# Patient Record
Sex: Male | Born: 1965 | Race: White | Hispanic: No | Marital: Married | Smoking: Never smoker
Health system: Southern US, Community
[De-identification: ages and names within clinical notes are randomized; demographics above are authoritative.]

## PROBLEM LIST (undated history)

## (undated) DIAGNOSIS — H9190 Unspecified hearing loss, unspecified ear: Secondary | ICD-10-CM

## (undated) DIAGNOSIS — Q631 Lobulated, fused and horseshoe kidney: Secondary | ICD-10-CM

## (undated) DIAGNOSIS — R109 Unspecified abdominal pain: Secondary | ICD-10-CM

## (undated) DIAGNOSIS — N451 Epididymitis: Secondary | ICD-10-CM

## (undated) DIAGNOSIS — N5089 Other specified disorders of the male genital organs: Secondary | ICD-10-CM

## (undated) DIAGNOSIS — R52 Pain, unspecified: Secondary | ICD-10-CM

## (undated) HISTORY — DX: Other specified disorders of the male genital organs: N50.89

## (undated) HISTORY — DX: Pain, unspecified: R52

## (undated) HISTORY — DX: Epididymitis: N45.1

## (undated) HISTORY — DX: Unspecified hearing loss, unspecified ear: H91.90

## (undated) HISTORY — DX: Unspecified abdominal pain: R10.9

## (undated) HISTORY — DX: Lobulated, fused and horseshoe kidney: Q63.1

---

## 2011-05-28 ENCOUNTER — Encounter (HOSPITAL_BASED_OUTPATIENT_CLINIC_OR_DEPARTMENT_OTHER): Payer: Self-pay | Admitting: *Deleted

## 2011-05-28 ENCOUNTER — Emergency Department (HOSPITAL_BASED_OUTPATIENT_CLINIC_OR_DEPARTMENT_OTHER)
Admission: EM | Admit: 2011-05-28 | Discharge: 2011-05-28 | Disposition: A | Discharge status: Home / Self Care | Payer: BC Managed Care – PPO | Attending: Emergency Medicine | Admitting: Emergency Medicine

## 2011-05-28 DIAGNOSIS — S0120XA Unspecified open wound of nose, initial encounter: Secondary | ICD-10-CM | POA: Insufficient documentation

## 2011-05-28 DIAGNOSIS — Y9373 Activity, racquet and hand sports: Secondary | ICD-10-CM | POA: Insufficient documentation

## 2011-05-28 DIAGNOSIS — W219XXA Striking against or struck by unspecified sports equipment, initial encounter: Secondary | ICD-10-CM | POA: Insufficient documentation

## 2011-05-28 DIAGNOSIS — S0121XA Laceration without foreign body of nose, initial encounter: Secondary | ICD-10-CM

## 2011-05-28 NOTE — ED Provider Notes (Signed)
Medical screening examination/treatment/procedure(s) were performed by non-physician practitioner and as supervising physician I was immediately available for consultation/collaboration.   Alejandria Wessells, MD 05/28/11 1448 

## 2011-05-28 NOTE — ED Provider Notes (Signed)
History     CSN: [id number]  Arrival date & time 05/28/11  1334   First MD Initiated Contact with Patient 05/28/11 1349      Chief Complaint  Patient presents with  . Facial Injury    (Consider location/radiation/quality/duration/timing/severity/associated sxs/prior treatment) Patient is a 46 y.o. male presenting with facial injury. The history is provided by the patient. No language interpreter was used.  Facial Injury  The incident occurred just prior to arrival. The injury mechanism was a direct blow. The injury was related to sports. There is an injury to the face. The pain is mild. It is unlikely that a foreign body is present. He has been behaving normally.  Pt was hit in the nose with a racquet ball racquet.  Pt has a laceration across his nose  History reviewed. No pertinent past medical history.  History reviewed. No pertinent past surgical history.  No family history on file.  History  Substance Use Topics  . Smoking status: Never Smoker   . Smokeless tobacco: Not on file  . Alcohol Use: No      Review of Systems  HENT: Positive for nosebleeds.   Skin: Positive for wound.  All other systems reviewed and are negative.    Allergies  Review of patient's allergies indicates no known allergies.  Home Medications  No current outpatient prescriptions on file.  BP 129/67  Pulse 80  Temp(Src) 98.1 F (36.7 C) (Oral)  Resp 18  Ht 6' (1.829 m)  Wt 165 lb (74.844 kg)  BMI 22.38 kg/m2  SpO2 100%  Physical Exam  Nursing note and vitals reviewed. Constitutional: He appears well-developed and well-nourished.  HENT:  Head: Normocephalic.  Right Ear: External ear normal.  Left Ear: External ear normal.  Nose: Nose normal.  Mouth/Throat: Oropharynx is clear and moist.       5mm laceration across nose  Eyes: Conjunctivae and EOM are normal. Pupils are equal, round, and reactive to light.  Neck: Normal range of motion. Neck supple.  Cardiovascular: Normal  rate and normal heart sounds.   Pulmonary/Chest: Effort normal.  Musculoskeletal: Normal range of motion.  Neurological: He is alert.  Skin: Skin is warm.  Psychiatric: He has a normal mood and affect.    ED Course  LACERATION REPAIR Date/Time: 05/28/2011 2:43 PM Performed by: Langston Masker Authorized by: Langston Masker Consent: Verbal consent not obtained. Risks and benefits: risks, benefits and alternatives were discussed Consent given by: patient Time out: Immediately prior to procedure a "time out" was called to verify the correct patient, procedure, equipment, support staff and site/side marked as required. Body area: head/neck Laceration length: 0.5 cm Foreign bodies: no foreign bodies Tendon involvement: none Preparation: Patient was prepped and draped in the usual sterile fashion. Irrigation solution: saline Skin closure: glue Approximation difficulty: simple Patient tolerance: Patient tolerated the procedure well with no immediate complications.   (including critical care time)  Labs Reviewed - No data to display No results found.   1. Laceration of nose       MDM          Langston Masker, [place] 05/28/11 1445

## 2011-05-28 NOTE — Discharge Instructions (Signed)
Facial Laceration A facial laceration is a cut on the face. It can take 1 to 2 years for the scar to heal completely. HOME CARE  For stitches (sutures):  Keep the cut clean and dry.   If you have a bandage (dressing), change it at least once a day. Change the bandage if it gets wet or dirty, or as told by your doctor.   Wash the cut with soap and water 2 times a day. Rinse the cut with water. Pat it dry with a clean towel.   Put a thin layer of medicated cream on the cut as told by your doctor.   You may shower after the first 24 hours. Do not soak the cut in water until the stitches are removed.   Only take medicines as told by your doctor.   Have your stitches removed as told by your doctor.   Do not wear makeup until a few days after your stitches are removed.  For skin adhesive strips:  Keep the cut clean and dry.   Do not get the strips wet. You may take a bath, but be careful to keep the cut dry.   If the cut gets wet, pat it dry with a clean towel.   The strips will fall off on their own. Do not remove the strips that are still stuck to the cut.  For wound glue:  You may shower or take baths. Do not soak or scrub the cut. Do not swim. Avoid heavy sweating until the glue falls off on its own. After a shower or bath, pat the cut dry with a clean towel.   Do not put medicine or makeup on your cut until the glue falls off.   If you have a bandage, do not put tape over the glue.   Avoid lots of sunlight or tanning lamps until the glue falls off. Put sunscreen on the cut for the first year to reduce your scar.   The glue will fall off on its own. Do not pick at the glue.  You may need a tetanus shot if:  You cannot remember when you had your last tetanus shot.   You have never had a tetanus shot.  If you need a tetanus shot and you choose not to have one, you may get tetanus. Sickness from tetanus can be serious. GET HELP RIGHT AWAY IF:   Your cut gets red, painful,  or puffy (swollen).   There is yellowish-white fluid (pus) coming from the cut.   You have chills or a fever.  MAKE SURE YOU:   Understand these instructions.   Will watch your condition.   Will get help right away if you are not doing well or get worse.  Document Released: 09/01/2007 Document Revised: 11/25/2010 Document Reviewed: 09/08/2010 [employer] Patient Information 2012 [employer] 

## 2011-05-28 NOTE — ED Notes (Signed)
Hit in the face with a racket while playing racket ball this am. Laceration across the bridge of his nose. Bleeding controlled.

## 2013-06-20 ENCOUNTER — Other Ambulatory Visit (HOSPITAL_BASED_OUTPATIENT_CLINIC_OR_DEPARTMENT_OTHER): Payer: Self-pay | Admitting: Family Medicine

## 2013-06-20 DIAGNOSIS — N509 Disorder of male genital organs, unspecified: Secondary | ICD-10-CM

## 2013-06-20 DIAGNOSIS — R52 Pain, unspecified: Secondary | ICD-10-CM

## 2013-06-21 ENCOUNTER — Other Ambulatory Visit (HOSPITAL_BASED_OUTPATIENT_CLINIC_OR_DEPARTMENT_OTHER): Payer: Self-pay | Admitting: Family Medicine

## 2013-06-21 DIAGNOSIS — N509 Disorder of male genital organs, unspecified: Secondary | ICD-10-CM

## 2013-06-21 DIAGNOSIS — R52 Pain, unspecified: Secondary | ICD-10-CM

## 2013-06-22 ENCOUNTER — Ambulatory Visit (HOSPITAL_BASED_OUTPATIENT_CLINIC_OR_DEPARTMENT_OTHER): Payer: BC Managed Care – PPO

## 2013-06-23 ENCOUNTER — Ambulatory Visit (HOSPITAL_BASED_OUTPATIENT_CLINIC_OR_DEPARTMENT_OTHER)
Admission: RE | Admit: 2013-06-23 | Discharge: 2013-06-23 | Disposition: A | Discharge status: Home / Self Care | Payer: BC Managed Care – PPO | Source: Ambulatory Visit | Attending: Family Medicine | Admitting: Family Medicine

## 2013-06-23 DIAGNOSIS — R52 Pain, unspecified: Secondary | ICD-10-CM

## 2013-06-23 DIAGNOSIS — N509 Disorder of male genital organs, unspecified: Secondary | ICD-10-CM

## 2013-06-23 DIAGNOSIS — N508 Other specified disorders of male genital organs: Secondary | ICD-10-CM | POA: Insufficient documentation

## 2013-06-26 ENCOUNTER — Ambulatory Visit (HOSPITAL_BASED_OUTPATIENT_CLINIC_OR_DEPARTMENT_OTHER): Payer: BC Managed Care – PPO

## 2013-06-27 ENCOUNTER — Other Ambulatory Visit (HOSPITAL_COMMUNITY): Payer: Self-pay | Admitting: Urology

## 2013-06-27 DIAGNOSIS — N5089 Other specified disorders of the male genital organs: Secondary | ICD-10-CM

## 2013-08-22 ENCOUNTER — Ambulatory Visit (HOSPITAL_COMMUNITY)
Admission: RE | Admit: 2013-08-22 | Discharge: 2013-08-22 | Disposition: A | Discharge status: Home / Self Care | Payer: BC Managed Care – PPO | Source: Ambulatory Visit | Attending: Urology | Admitting: Urology

## 2013-08-22 DIAGNOSIS — N508 Other specified disorders of male genital organs: Secondary | ICD-10-CM | POA: Insufficient documentation

## 2013-08-22 DIAGNOSIS — N5089 Other specified disorders of the male genital organs: Secondary | ICD-10-CM

## 2013-11-21 ENCOUNTER — Other Ambulatory Visit (HOSPITAL_COMMUNITY): Payer: Self-pay | Admitting: Urology

## 2013-11-21 DIAGNOSIS — N508 Other specified disorders of male genital organs: Secondary | ICD-10-CM

## 2013-11-28 ENCOUNTER — Other Ambulatory Visit (HOSPITAL_COMMUNITY): Payer: Self-pay | Admitting: Urology

## 2013-11-28 DIAGNOSIS — N508 Other specified disorders of male genital organs: Secondary | ICD-10-CM

## 2013-12-04 ENCOUNTER — Ambulatory Visit (HOSPITAL_COMMUNITY): Payer: BC Managed Care – PPO

## 2013-12-13 ENCOUNTER — Ambulatory Visit (HOSPITAL_COMMUNITY): Payer: BC Managed Care – PPO

## 2013-12-14 ENCOUNTER — Ambulatory Visit (HOSPITAL_COMMUNITY)
Admission: RE | Admit: 2013-12-14 | Discharge: 2013-12-14 | Disposition: A | Discharge status: Home / Self Care | Payer: BC Managed Care – PPO | Source: Ambulatory Visit | Attending: Urology | Admitting: Urology

## 2013-12-14 DIAGNOSIS — N433 Hydrocele, unspecified: Secondary | ICD-10-CM | POA: Insufficient documentation

## 2013-12-14 DIAGNOSIS — N508 Other specified disorders of male genital organs: Secondary | ICD-10-CM

## 2014-07-19 ENCOUNTER — Other Ambulatory Visit: Payer: Self-pay | Admitting: Family Medicine

## 2014-07-19 DIAGNOSIS — M545 Low back pain: Secondary | ICD-10-CM

## 2014-08-04 ENCOUNTER — Other Ambulatory Visit: Payer: Self-pay

## 2014-08-07 ENCOUNTER — Ambulatory Visit
Admission: RE | Admit: 2014-08-07 | Discharge: 2014-08-07 | Disposition: A | Discharge status: Home / Self Care | Payer: BLUE CROSS/BLUE SHIELD | Source: Ambulatory Visit | Attending: Family Medicine | Admitting: Family Medicine

## 2014-08-07 DIAGNOSIS — M545 Low back pain: Secondary | ICD-10-CM

## 2016-08-24 DIAGNOSIS — H10413 Chronic giant papillary conjunctivitis, bilateral: Secondary | ICD-10-CM | POA: Diagnosis not present

## 2016-12-28 DIAGNOSIS — Z1211 Encounter for screening for malignant neoplasm of colon: Secondary | ICD-10-CM | POA: Diagnosis not present

## 2017-01-26 DIAGNOSIS — K589 Irritable bowel syndrome without diarrhea: Secondary | ICD-10-CM | POA: Diagnosis not present

## 2017-01-26 DIAGNOSIS — Z1211 Encounter for screening for malignant neoplasm of colon: Secondary | ICD-10-CM | POA: Diagnosis not present

## 2017-01-26 DIAGNOSIS — D126 Benign neoplasm of colon, unspecified: Secondary | ICD-10-CM | POA: Diagnosis not present

## 2017-02-01 DIAGNOSIS — B353 Tinea pedis: Secondary | ICD-10-CM | POA: Diagnosis not present

## 2017-02-01 DIAGNOSIS — D225 Melanocytic nevi of trunk: Secondary | ICD-10-CM | POA: Diagnosis not present

## 2017-02-01 DIAGNOSIS — L821 Other seborrheic keratosis: Secondary | ICD-10-CM | POA: Diagnosis not present

## 2017-02-01 DIAGNOSIS — L814 Other melanin hyperpigmentation: Secondary | ICD-10-CM | POA: Diagnosis not present

## 2017-02-01 DIAGNOSIS — L309 Dermatitis, unspecified: Secondary | ICD-10-CM | POA: Diagnosis not present

## 2017-03-16 DIAGNOSIS — Z136 Encounter for screening for cardiovascular disorders: Secondary | ICD-10-CM | POA: Diagnosis not present

## 2017-03-16 DIAGNOSIS — Z131 Encounter for screening for diabetes mellitus: Secondary | ICD-10-CM | POA: Diagnosis not present

## 2017-03-16 DIAGNOSIS — Z0000 Encounter for general adult medical examination without abnormal findings: Secondary | ICD-10-CM | POA: Diagnosis not present

## 2017-05-02 DIAGNOSIS — S43005A Unspecified dislocation of left shoulder joint, initial encounter: Secondary | ICD-10-CM | POA: Diagnosis not present

## 2017-05-02 DIAGNOSIS — M25311 Other instability, right shoulder: Secondary | ICD-10-CM | POA: Diagnosis not present

## 2017-05-09 DIAGNOSIS — M545 Low back pain: Secondary | ICD-10-CM | POA: Diagnosis not present

## 2017-05-16 DIAGNOSIS — M545 Low back pain: Secondary | ICD-10-CM | POA: Diagnosis not present

## 2017-05-16 DIAGNOSIS — M25311 Other instability, right shoulder: Secondary | ICD-10-CM | POA: Diagnosis not present

## 2017-05-16 DIAGNOSIS — M25511 Pain in right shoulder: Secondary | ICD-10-CM | POA: Diagnosis not present

## 2017-05-18 ENCOUNTER — Other Ambulatory Visit: Payer: Self-pay | Admitting: Family Medicine

## 2017-05-18 DIAGNOSIS — M67911 Unspecified disorder of synovium and tendon, right shoulder: Secondary | ICD-10-CM

## 2017-05-22 ENCOUNTER — Ambulatory Visit
Admission: RE | Admit: 2017-05-22 | Discharge: 2017-05-22 | Disposition: A | Discharge status: Home / Self Care | Payer: 59 | Source: Ambulatory Visit | Attending: Family Medicine | Admitting: Family Medicine

## 2017-05-22 DIAGNOSIS — S43006A Unspecified dislocation of unspecified shoulder joint, initial encounter: Secondary | ICD-10-CM | POA: Diagnosis not present

## 2017-05-22 DIAGNOSIS — M67911 Unspecified disorder of synovium and tendon, right shoulder: Secondary | ICD-10-CM

## 2017-05-25 DIAGNOSIS — M75111 Incomplete rotator cuff tear or rupture of right shoulder, not specified as traumatic: Secondary | ICD-10-CM | POA: Diagnosis not present

## 2017-05-28 DIAGNOSIS — S43394A Dislocation of other parts of right shoulder girdle, initial encounter: Secondary | ICD-10-CM | POA: Diagnosis not present

## 2017-05-28 DIAGNOSIS — S46091A Other injury of muscle(s) and tendon(s) of the rotator cuff of right shoulder, initial encounter: Secondary | ICD-10-CM | POA: Diagnosis not present

## 2017-05-30 DIAGNOSIS — S43394A Dislocation of other parts of right shoulder girdle, initial encounter: Secondary | ICD-10-CM | POA: Diagnosis not present

## 2017-05-30 DIAGNOSIS — S46091A Other injury of muscle(s) and tendon(s) of the rotator cuff of right shoulder, initial encounter: Secondary | ICD-10-CM | POA: Diagnosis not present

## 2017-06-01 DIAGNOSIS — S43394A Dislocation of other parts of right shoulder girdle, initial encounter: Secondary | ICD-10-CM | POA: Diagnosis not present

## 2017-06-01 DIAGNOSIS — S46091A Other injury of muscle(s) and tendon(s) of the rotator cuff of right shoulder, initial encounter: Secondary | ICD-10-CM | POA: Diagnosis not present

## 2017-06-03 DIAGNOSIS — E7800 Pure hypercholesterolemia, unspecified: Secondary | ICD-10-CM | POA: Diagnosis not present

## 2017-06-03 DIAGNOSIS — S43394A Dislocation of other parts of right shoulder girdle, initial encounter: Secondary | ICD-10-CM | POA: Diagnosis not present

## 2017-06-03 DIAGNOSIS — S46091A Other injury of muscle(s) and tendon(s) of the rotator cuff of right shoulder, initial encounter: Secondary | ICD-10-CM | POA: Diagnosis not present

## 2017-06-14 DIAGNOSIS — S46091A Other injury of muscle(s) and tendon(s) of the rotator cuff of right shoulder, initial encounter: Secondary | ICD-10-CM | POA: Diagnosis not present

## 2017-06-14 DIAGNOSIS — S43394A Dislocation of other parts of right shoulder girdle, initial encounter: Secondary | ICD-10-CM | POA: Diagnosis not present

## 2017-06-15 DIAGNOSIS — M75111 Incomplete rotator cuff tear or rupture of right shoulder, not specified as traumatic: Secondary | ICD-10-CM | POA: Diagnosis not present

## 2017-06-16 DIAGNOSIS — Z23 Encounter for immunization: Secondary | ICD-10-CM | POA: Diagnosis not present

## 2017-06-23 DIAGNOSIS — J309 Allergic rhinitis, unspecified: Secondary | ICD-10-CM | POA: Diagnosis not present

## 2017-07-04 DIAGNOSIS — M7541 Impingement syndrome of right shoulder: Secondary | ICD-10-CM | POA: Diagnosis not present

## 2017-07-04 DIAGNOSIS — M7501 Adhesive capsulitis of right shoulder: Secondary | ICD-10-CM | POA: Diagnosis not present

## 2017-07-04 DIAGNOSIS — S46011A Strain of muscle(s) and tendon(s) of the rotator cuff of right shoulder, initial encounter: Secondary | ICD-10-CM | POA: Diagnosis not present

## 2017-07-04 DIAGNOSIS — S46011S Strain of muscle(s) and tendon(s) of the rotator cuff of right shoulder, sequela: Secondary | ICD-10-CM | POA: Diagnosis not present

## 2017-07-04 DIAGNOSIS — G8918 Other acute postprocedural pain: Secondary | ICD-10-CM | POA: Diagnosis not present

## 2017-07-05 DIAGNOSIS — M7501 Adhesive capsulitis of right shoulder: Secondary | ICD-10-CM | POA: Diagnosis not present

## 2017-07-05 DIAGNOSIS — M25511 Pain in right shoulder: Secondary | ICD-10-CM | POA: Diagnosis not present

## 2017-07-07 DIAGNOSIS — M7501 Adhesive capsulitis of right shoulder: Secondary | ICD-10-CM | POA: Diagnosis not present

## 2017-07-07 DIAGNOSIS — M25511 Pain in right shoulder: Secondary | ICD-10-CM | POA: Diagnosis not present

## 2017-07-08 DIAGNOSIS — M7501 Adhesive capsulitis of right shoulder: Secondary | ICD-10-CM | POA: Diagnosis not present

## 2017-07-08 DIAGNOSIS — M25511 Pain in right shoulder: Secondary | ICD-10-CM | POA: Diagnosis not present

## 2017-07-11 DIAGNOSIS — M25511 Pain in right shoulder: Secondary | ICD-10-CM | POA: Diagnosis not present

## 2017-07-14 DIAGNOSIS — M25511 Pain in right shoulder: Secondary | ICD-10-CM | POA: Diagnosis not present

## 2017-07-19 DIAGNOSIS — M25511 Pain in right shoulder: Secondary | ICD-10-CM | POA: Diagnosis not present

## 2017-07-19 DIAGNOSIS — Z23 Encounter for immunization: Secondary | ICD-10-CM | POA: Diagnosis not present

## 2017-07-21 DIAGNOSIS — M25511 Pain in right shoulder: Secondary | ICD-10-CM | POA: Diagnosis not present

## 2017-07-26 DIAGNOSIS — M25511 Pain in right shoulder: Secondary | ICD-10-CM | POA: Diagnosis not present

## 2017-07-28 DIAGNOSIS — M25511 Pain in right shoulder: Secondary | ICD-10-CM | POA: Diagnosis not present

## 2017-08-02 DIAGNOSIS — M25511 Pain in right shoulder: Secondary | ICD-10-CM | POA: Diagnosis not present

## 2017-08-05 DIAGNOSIS — M25511 Pain in right shoulder: Secondary | ICD-10-CM | POA: Diagnosis not present

## 2017-08-08 DIAGNOSIS — M25511 Pain in right shoulder: Secondary | ICD-10-CM | POA: Diagnosis not present

## 2017-08-12 DIAGNOSIS — M25511 Pain in right shoulder: Secondary | ICD-10-CM | POA: Diagnosis not present

## 2017-08-15 DIAGNOSIS — M25511 Pain in right shoulder: Secondary | ICD-10-CM | POA: Diagnosis not present

## 2017-08-18 DIAGNOSIS — M25511 Pain in right shoulder: Secondary | ICD-10-CM | POA: Diagnosis not present

## 2017-08-23 DIAGNOSIS — M25511 Pain in right shoulder: Secondary | ICD-10-CM | POA: Diagnosis not present

## 2017-08-25 DIAGNOSIS — Z0100 Encounter for examination of eyes and vision without abnormal findings: Secondary | ICD-10-CM | POA: Diagnosis not present

## 2017-08-26 DIAGNOSIS — M25511 Pain in right shoulder: Secondary | ICD-10-CM | POA: Diagnosis not present

## 2017-08-29 DIAGNOSIS — M25511 Pain in right shoulder: Secondary | ICD-10-CM | POA: Diagnosis not present

## 2017-08-31 DIAGNOSIS — M25511 Pain in right shoulder: Secondary | ICD-10-CM | POA: Diagnosis not present

## 2017-09-05 DIAGNOSIS — M25511 Pain in right shoulder: Secondary | ICD-10-CM | POA: Diagnosis not present

## 2017-09-09 DIAGNOSIS — M25511 Pain in right shoulder: Secondary | ICD-10-CM | POA: Diagnosis not present

## 2017-09-14 DIAGNOSIS — M25511 Pain in right shoulder: Secondary | ICD-10-CM | POA: Diagnosis not present

## 2017-09-21 DIAGNOSIS — M25511 Pain in right shoulder: Secondary | ICD-10-CM | POA: Diagnosis not present

## 2017-10-27 DIAGNOSIS — N4341 Spermatocele of epididymis, single: Secondary | ICD-10-CM | POA: Diagnosis not present

## 2017-10-28 DIAGNOSIS — M5416 Radiculopathy, lumbar region: Secondary | ICD-10-CM | POA: Diagnosis not present

## 2017-12-08 DIAGNOSIS — M5416 Radiculopathy, lumbar region: Secondary | ICD-10-CM | POA: Diagnosis not present

## 2018-01-11 DIAGNOSIS — M5416 Radiculopathy, lumbar region: Secondary | ICD-10-CM | POA: Diagnosis not present

## 2018-01-16 DIAGNOSIS — M545 Low back pain: Secondary | ICD-10-CM | POA: Diagnosis not present

## 2018-01-19 DIAGNOSIS — M545 Low back pain: Secondary | ICD-10-CM | POA: Diagnosis not present

## 2018-01-24 DIAGNOSIS — M545 Low back pain: Secondary | ICD-10-CM | POA: Diagnosis not present

## 2018-01-26 DIAGNOSIS — M545 Low back pain: Secondary | ICD-10-CM | POA: Diagnosis not present

## 2018-01-31 DIAGNOSIS — M545 Low back pain: Secondary | ICD-10-CM | POA: Diagnosis not present

## 2018-02-02 DIAGNOSIS — M545 Low back pain: Secondary | ICD-10-CM | POA: Diagnosis not present

## 2018-02-06 DIAGNOSIS — M545 Low back pain: Secondary | ICD-10-CM | POA: Diagnosis not present

## 2018-02-07 DIAGNOSIS — M5416 Radiculopathy, lumbar region: Secondary | ICD-10-CM | POA: Diagnosis not present

## 2018-02-08 DIAGNOSIS — Z23 Encounter for immunization: Secondary | ICD-10-CM | POA: Diagnosis not present

## 2018-02-09 DIAGNOSIS — M545 Low back pain: Secondary | ICD-10-CM | POA: Diagnosis not present

## 2018-02-13 DIAGNOSIS — M545 Low back pain: Secondary | ICD-10-CM | POA: Diagnosis not present

## 2018-02-16 DIAGNOSIS — M545 Low back pain: Secondary | ICD-10-CM | POA: Diagnosis not present

## 2018-02-20 DIAGNOSIS — M545 Low back pain: Secondary | ICD-10-CM | POA: Diagnosis not present

## 2018-02-22 DIAGNOSIS — M545 Low back pain: Secondary | ICD-10-CM | POA: Diagnosis not present

## 2018-02-28 DIAGNOSIS — M545 Low back pain: Secondary | ICD-10-CM | POA: Diagnosis not present

## 2018-03-02 DIAGNOSIS — M545 Low back pain: Secondary | ICD-10-CM | POA: Diagnosis not present

## 2018-03-04 DIAGNOSIS — Z23 Encounter for immunization: Secondary | ICD-10-CM | POA: Diagnosis not present

## 2018-04-05 ENCOUNTER — Other Ambulatory Visit: Payer: Self-pay | Admitting: Physical Medicine and Rehabilitation

## 2018-04-05 DIAGNOSIS — M5416 Radiculopathy, lumbar region: Secondary | ICD-10-CM

## 2018-04-12 ENCOUNTER — Encounter

## 2018-04-12 ENCOUNTER — Ambulatory Visit
Admission: RE | Admit: 2018-04-12 | Discharge: 2018-04-12 | Disposition: A | Discharge status: Home / Self Care | Payer: BLUE CROSS/BLUE SHIELD | Source: Ambulatory Visit | Attending: Physical Medicine and Rehabilitation | Admitting: Physical Medicine and Rehabilitation

## 2018-04-12 DIAGNOSIS — M5416 Radiculopathy, lumbar region: Secondary | ICD-10-CM

## 2018-04-12 DIAGNOSIS — M47816 Spondylosis without myelopathy or radiculopathy, lumbar region: Secondary | ICD-10-CM | POA: Diagnosis not present

## 2018-04-12 DIAGNOSIS — M5127 Other intervertebral disc displacement, lumbosacral region: Secondary | ICD-10-CM | POA: Diagnosis not present

## 2018-04-12 DIAGNOSIS — M5126 Other intervertebral disc displacement, lumbar region: Secondary | ICD-10-CM | POA: Diagnosis not present

## 2018-04-21 DIAGNOSIS — M5416 Radiculopathy, lumbar region: Secondary | ICD-10-CM | POA: Diagnosis not present

## 2018-05-04 DIAGNOSIS — M5416 Radiculopathy, lumbar region: Secondary | ICD-10-CM | POA: Diagnosis not present

## 2018-05-09 DIAGNOSIS — M5416 Radiculopathy, lumbar region: Secondary | ICD-10-CM | POA: Diagnosis not present

## 2018-05-12 DIAGNOSIS — M5416 Radiculopathy, lumbar region: Secondary | ICD-10-CM | POA: Diagnosis not present

## 2018-05-15 DIAGNOSIS — M5416 Radiculopathy, lumbar region: Secondary | ICD-10-CM | POA: Diagnosis not present

## 2018-05-16 DIAGNOSIS — M79671 Pain in right foot: Secondary | ICD-10-CM | POA: Diagnosis not present

## 2018-05-16 DIAGNOSIS — M19079 Primary osteoarthritis, unspecified ankle and foot: Secondary | ICD-10-CM | POA: Diagnosis not present

## 2018-05-17 DIAGNOSIS — M5416 Radiculopathy, lumbar region: Secondary | ICD-10-CM | POA: Diagnosis not present

## 2018-05-22 DIAGNOSIS — M5416 Radiculopathy, lumbar region: Secondary | ICD-10-CM | POA: Diagnosis not present

## 2018-05-25 DIAGNOSIS — M5416 Radiculopathy, lumbar region: Secondary | ICD-10-CM | POA: Diagnosis not present

## 2018-05-29 DIAGNOSIS — L821 Other seborrheic keratosis: Secondary | ICD-10-CM | POA: Diagnosis not present

## 2018-05-29 DIAGNOSIS — Z23 Encounter for immunization: Secondary | ICD-10-CM | POA: Diagnosis not present

## 2018-05-29 DIAGNOSIS — L570 Actinic keratosis: Secondary | ICD-10-CM | POA: Diagnosis not present

## 2018-05-29 DIAGNOSIS — L814 Other melanin hyperpigmentation: Secondary | ICD-10-CM | POA: Diagnosis not present

## 2018-05-29 DIAGNOSIS — D225 Melanocytic nevi of trunk: Secondary | ICD-10-CM | POA: Diagnosis not present

## 2018-05-29 DIAGNOSIS — D485 Neoplasm of uncertain behavior of skin: Secondary | ICD-10-CM | POA: Diagnosis not present

## 2018-05-30 DIAGNOSIS — M5416 Radiculopathy, lumbar region: Secondary | ICD-10-CM | POA: Diagnosis not present

## 2018-06-01 DIAGNOSIS — M5416 Radiculopathy, lumbar region: Secondary | ICD-10-CM | POA: Diagnosis not present

## 2018-06-08 DIAGNOSIS — M5416 Radiculopathy, lumbar region: Secondary | ICD-10-CM | POA: Diagnosis not present

## 2018-08-29 DIAGNOSIS — H52203 Unspecified astigmatism, bilateral: Secondary | ICD-10-CM | POA: Diagnosis not present

## 2018-08-29 DIAGNOSIS — H5213 Myopia, bilateral: Secondary | ICD-10-CM | POA: Diagnosis not present

## 2018-08-29 DIAGNOSIS — H10413 Chronic giant papillary conjunctivitis, bilateral: Secondary | ICD-10-CM | POA: Diagnosis not present

## 2018-10-12 IMAGING — MR MR SHOULDER*R* W/O CM
5 series · 35 of 40 positions shown · non-contrast
Comparison: None.

CLINICAL DATA: Weakness and limited range of motion of the right
shoulder after a dislocation injury skiing 3 weeks prior to imaging.

EXAM:
MRI OF THE RIGHT SHOULDER WITHOUT CONTRAST
TECHNIQUE: Multiplanar, multisequence MR imaging of the shoulder was performed.
No intravenous contrast was administered.

[Series 6: T2 fat-sat · oblique · 4.0mm · 0.50mm/px · 8 of 16 slices shown (1 of 3)]
[im 1/16]
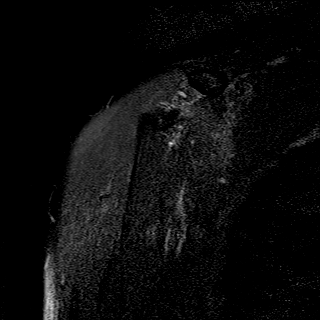
[im 3/16]
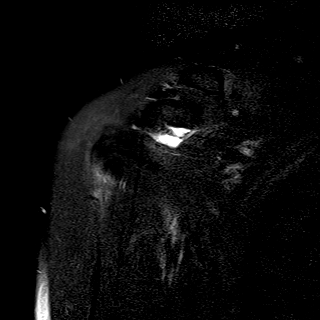
[im 5/16]
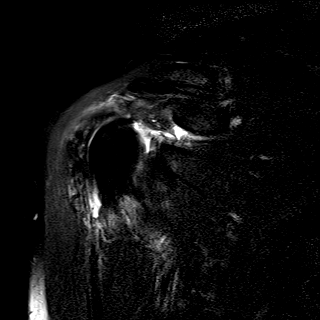
[im 7/16]
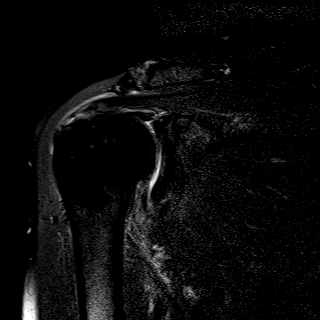
[im 9/16]
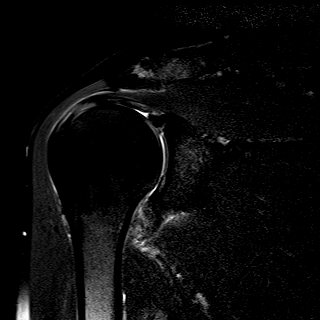
[im 11/16]
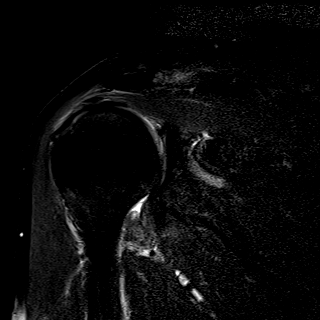
[im 13/16]
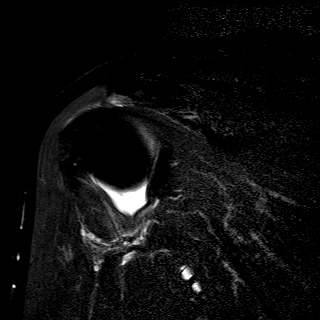
[im 16/16]
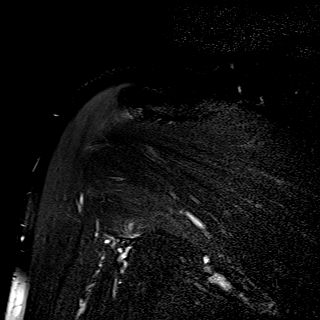

[Series 7: PD · oblique · 4.0mm · 0.50mm/px · 8 of 16 slices shown]
[im 1/16]
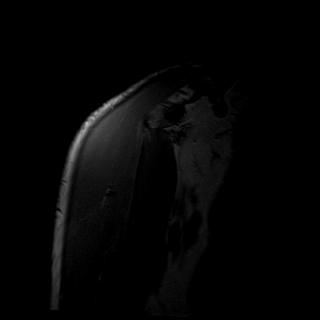
[im 3/16]
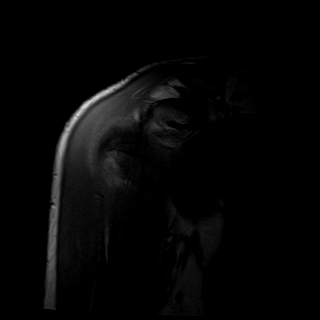
[im 5/16]
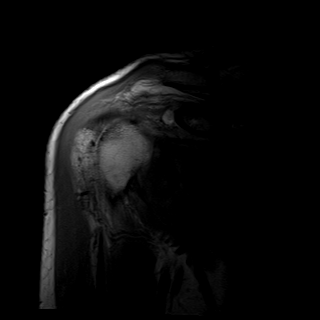
[im 7/16]
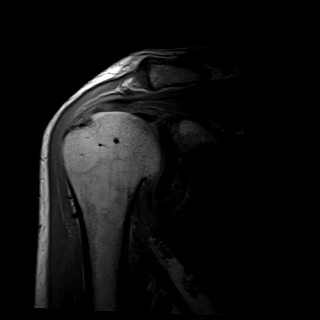
[im 9/16]
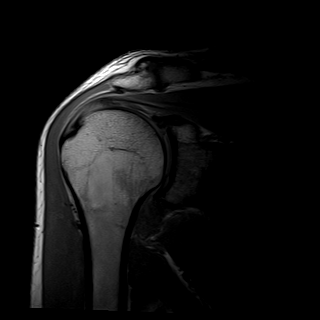
[im 11/16]
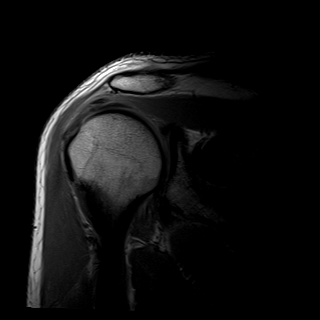
[im 13/16]
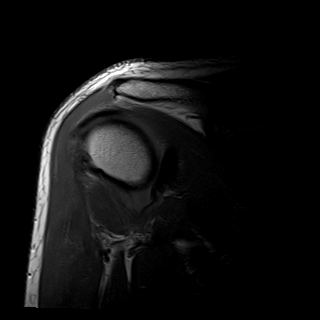
[im 16/16]
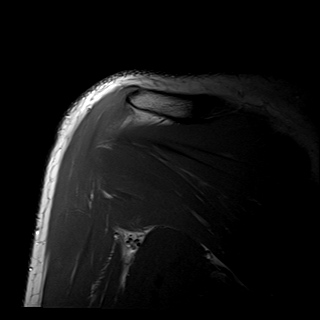

[Series 8: T2 fat-sat · oblique · 4.0mm · 0.50mm/px · 8 of 18 slices shown (2 of 3)]
[im 1/18]
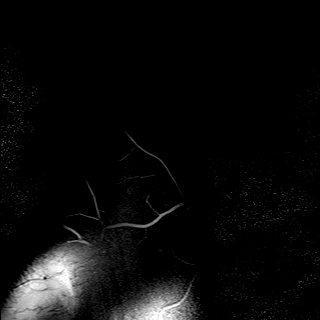
[im 3/18]
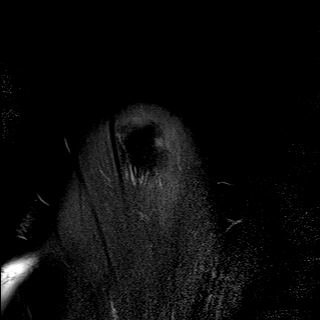
[im 5/18]
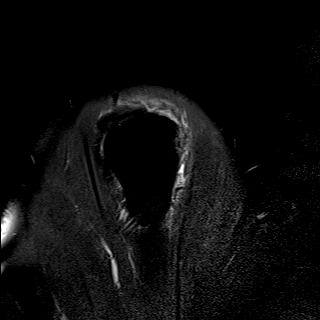
[im 8/18]
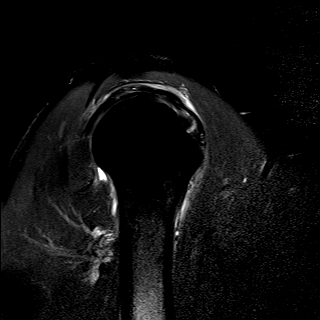
[im 10/18]
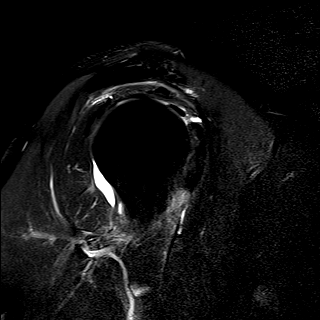
[im 13/18]
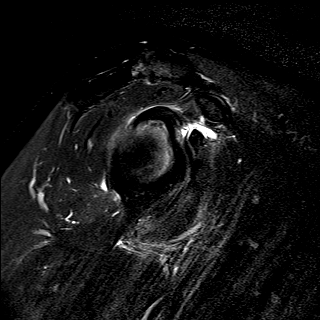
[im 15/18]
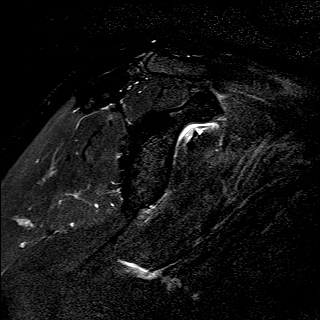
[im 18/18]
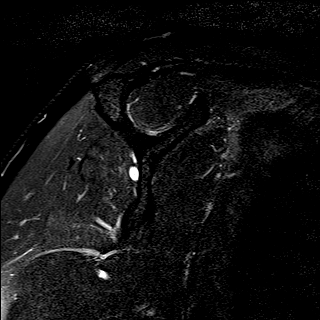

[Series 9: T1 · oblique · 4.0mm · 0.25mm/px · 3 of 18 slices shown]
[im 1/18]
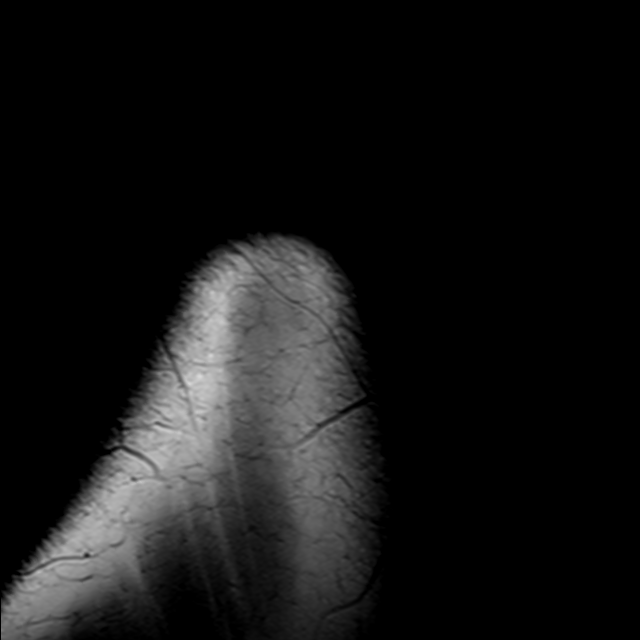
[im 3/18]
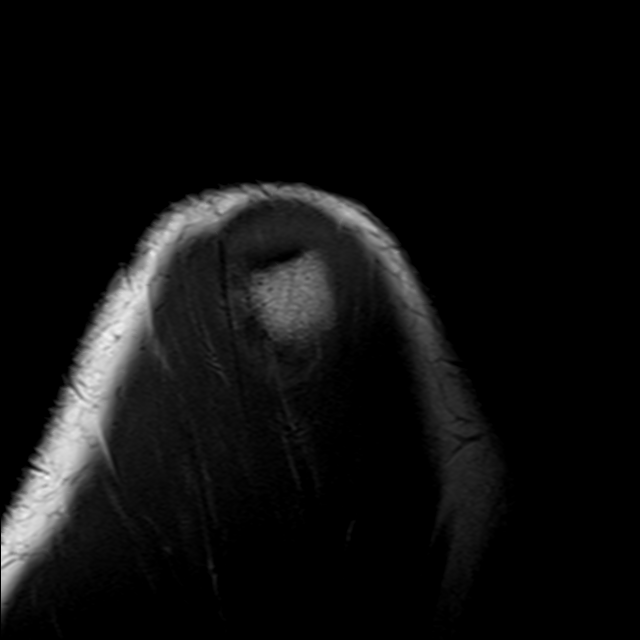
[im 5/18]
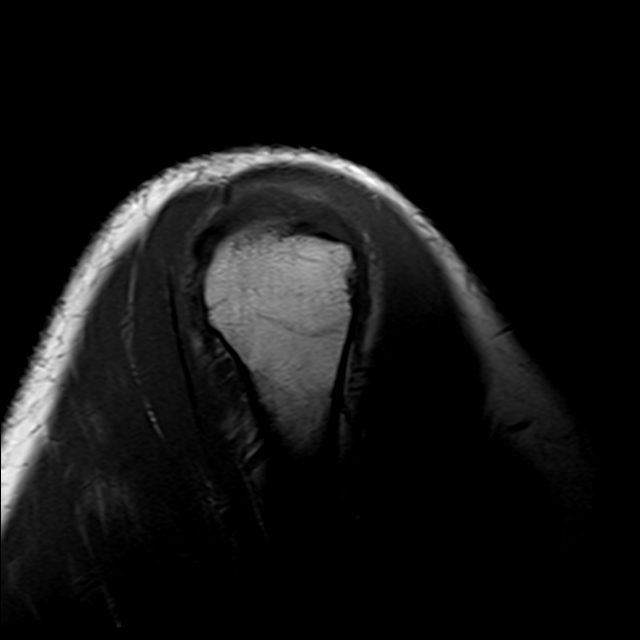

[Series 10: T2 fat-sat · axial · 4.0mm · 0.44mm/px · z∈[-34,+46]mm · 8 of 18 slices shown (3 of 3)]
[im 1/18]
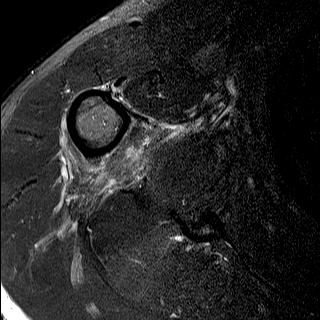
[im 3/18]
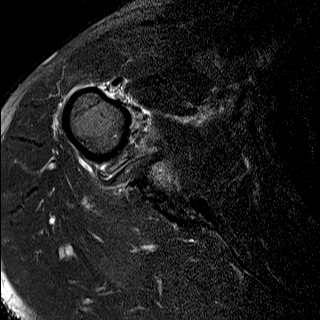
[im 5/18]
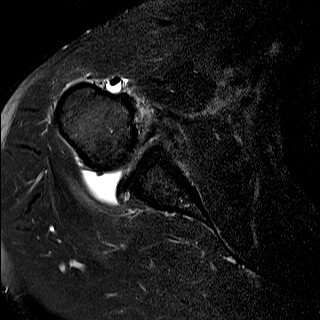
[im 8/18]
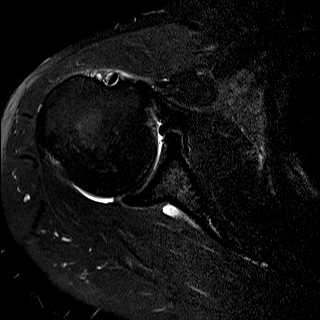
[im 10/18]
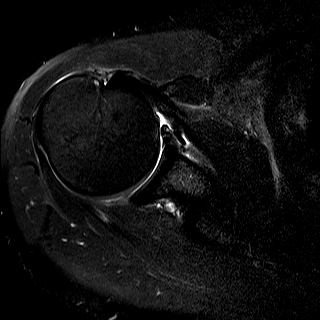
[im 13/18]
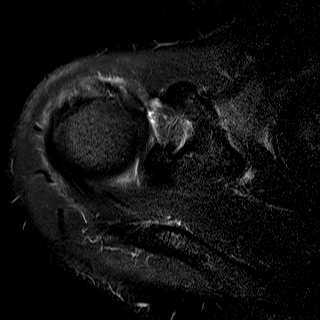
[im 15/18]
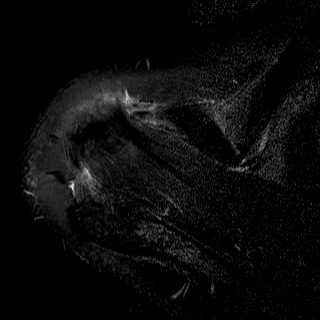
[im 18/18]
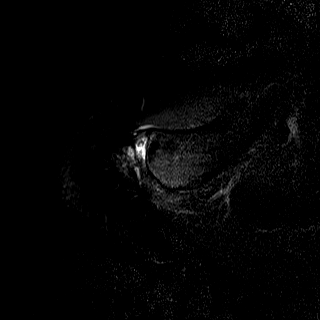

[35 of 40 positions shown; findings below may reference images not displayed]

FINDINGS: Rotator cuff: Moderate supraspinatus tendinopathy with distal
insertional fissuring and suspected partial thickness articular
surface tearing involving the supraspinatus insertion for example on
images 9-11 series 6. Mild tendinopathy of the subscapularis.

Muscles:  Unremarkable

Biceps long head:  Unremarkable

Acromioclavicular Joint: Mild spurring and mild edema within the AC
joint. Type II acromion. Small amount of fluid in the subacromial
subdeltoid bursa.

Glenohumeral Joint: Moderate degenerative chondral thinning.
Abnormal thickened and irregular inferior glenohumeral ligament
especially along the humeral attachment, raising the possibility of
humeral avulsion of the inferior glenohumeral ligament. Synovitis
might be an alternative cause for this appearance. Small
glenohumeral joint effusion.

Labrum: High suspicion for Sahira Lainez or [place] variant tear of the
anterior inferior labrum on images 12-14 series 10.

Bones:  No Hill-Sachs impaction is identified.

Other: No supplemental non-categorized findings.
IMPRESSION: 1. Prominent thickening of the inferior glenohumeral ligament
suspicious for tear, potentially at the humeral attachment.
2. Irregular anterior inferior glenoid labrum best appreciated on
the axial images, suspicious for Sahira Lainez tear.
3. Small glenohumeral joint effusion.
4. Mild subacromial subdeltoid bursitis.
5. Moderate supraspinatus tendinopathy with distal insertional
fissuring potentially extending to the articular surface. Mild
subscapularis tendinopathy.
6. Moderate degenerative chondral thinning in the glenohumeral
joint. Mild spurring of the AC joint.

## 2019-05-21 DIAGNOSIS — R300 Dysuria: Secondary | ICD-10-CM | POA: Diagnosis not present

## 2019-05-30 DIAGNOSIS — L821 Other seborrheic keratosis: Secondary | ICD-10-CM | POA: Diagnosis not present

## 2019-05-30 DIAGNOSIS — L814 Other melanin hyperpigmentation: Secondary | ICD-10-CM | POA: Diagnosis not present

## 2019-05-30 DIAGNOSIS — L578 Other skin changes due to chronic exposure to nonionizing radiation: Secondary | ICD-10-CM | POA: Diagnosis not present

## 2019-05-30 DIAGNOSIS — D485 Neoplasm of uncertain behavior of skin: Secondary | ICD-10-CM | POA: Diagnosis not present

## 2019-05-30 DIAGNOSIS — D225 Melanocytic nevi of trunk: Secondary | ICD-10-CM | POA: Diagnosis not present

## 2019-05-30 DIAGNOSIS — D044 Carcinoma in situ of skin of scalp and neck: Secondary | ICD-10-CM | POA: Diagnosis not present

## 2019-06-26 DIAGNOSIS — L570 Actinic keratosis: Secondary | ICD-10-CM | POA: Diagnosis not present

## 2019-06-26 DIAGNOSIS — D044 Carcinoma in situ of skin of scalp and neck: Secondary | ICD-10-CM | POA: Diagnosis not present

## 2019-06-27 DIAGNOSIS — R300 Dysuria: Secondary | ICD-10-CM | POA: Diagnosis not present

## 2019-08-16 DIAGNOSIS — Z13220 Encounter for screening for lipoid disorders: Secondary | ICD-10-CM | POA: Diagnosis not present

## 2019-08-16 DIAGNOSIS — Z131 Encounter for screening for diabetes mellitus: Secondary | ICD-10-CM | POA: Diagnosis not present

## 2019-08-16 DIAGNOSIS — Z23 Encounter for immunization: Secondary | ICD-10-CM | POA: Diagnosis not present

## 2019-08-16 DIAGNOSIS — Z125 Encounter for screening for malignant neoplasm of prostate: Secondary | ICD-10-CM | POA: Diagnosis not present

## 2019-08-16 DIAGNOSIS — Z0000 Encounter for general adult medical examination without abnormal findings: Secondary | ICD-10-CM | POA: Diagnosis not present

## 2019-09-10 DIAGNOSIS — H10413 Chronic giant papillary conjunctivitis, bilateral: Secondary | ICD-10-CM | POA: Diagnosis not present

## 2019-09-10 DIAGNOSIS — H5213 Myopia, bilateral: Secondary | ICD-10-CM | POA: Diagnosis not present

## 2019-09-11 DIAGNOSIS — R351 Nocturia: Secondary | ICD-10-CM | POA: Diagnosis not present

## 2019-09-11 DIAGNOSIS — R3916 Straining to void: Secondary | ICD-10-CM | POA: Diagnosis not present

## 2019-09-11 DIAGNOSIS — R300 Dysuria: Secondary | ICD-10-CM | POA: Diagnosis not present

## 2019-09-11 DIAGNOSIS — R350 Frequency of micturition: Secondary | ICD-10-CM | POA: Diagnosis not present

## 2019-10-08 DIAGNOSIS — R351 Nocturia: Secondary | ICD-10-CM | POA: Diagnosis not present

## 2019-10-08 DIAGNOSIS — N5201 Erectile dysfunction due to arterial insufficiency: Secondary | ICD-10-CM | POA: Diagnosis not present

## 2019-10-08 DIAGNOSIS — R3916 Straining to void: Secondary | ICD-10-CM | POA: Diagnosis not present

## 2019-10-08 DIAGNOSIS — R300 Dysuria: Secondary | ICD-10-CM | POA: Diagnosis not present

## 2019-10-29 DIAGNOSIS — E7800 Pure hypercholesterolemia, unspecified: Secondary | ICD-10-CM | POA: Diagnosis not present

## 2019-10-29 DIAGNOSIS — Z79899 Other long term (current) drug therapy: Secondary | ICD-10-CM | POA: Diagnosis not present

## 2019-11-20 DIAGNOSIS — R3915 Urgency of urination: Secondary | ICD-10-CM | POA: Diagnosis not present

## 2019-11-20 DIAGNOSIS — R350 Frequency of micturition: Secondary | ICD-10-CM | POA: Diagnosis not present

## 2019-11-29 DIAGNOSIS — R3915 Urgency of urination: Secondary | ICD-10-CM | POA: Diagnosis not present

## 2019-11-29 DIAGNOSIS — N509 Disorder of male genital organs, unspecified: Secondary | ICD-10-CM | POA: Diagnosis not present

## 2019-11-29 DIAGNOSIS — N5201 Erectile dysfunction due to arterial insufficiency: Secondary | ICD-10-CM | POA: Diagnosis not present

## 2019-12-13 DIAGNOSIS — R102 Pelvic and perineal pain: Secondary | ICD-10-CM | POA: Diagnosis not present

## 2019-12-13 DIAGNOSIS — N5201 Erectile dysfunction due to arterial insufficiency: Secondary | ICD-10-CM | POA: Diagnosis not present

## 2019-12-13 DIAGNOSIS — R350 Frequency of micturition: Secondary | ICD-10-CM | POA: Diagnosis not present

## 2020-01-03 DIAGNOSIS — N5201 Erectile dysfunction due to arterial insufficiency: Secondary | ICD-10-CM | POA: Diagnosis not present

## 2020-01-03 DIAGNOSIS — R102 Pelvic and perineal pain: Secondary | ICD-10-CM | POA: Diagnosis not present

## 2020-01-03 DIAGNOSIS — R351 Nocturia: Secondary | ICD-10-CM | POA: Diagnosis not present

## 2020-01-29 DIAGNOSIS — N5201 Erectile dysfunction due to arterial insufficiency: Secondary | ICD-10-CM | POA: Diagnosis not present

## 2020-01-29 DIAGNOSIS — M62838 Other muscle spasm: Secondary | ICD-10-CM | POA: Diagnosis not present

## 2020-01-29 DIAGNOSIS — R102 Pelvic and perineal pain: Secondary | ICD-10-CM | POA: Diagnosis not present

## 2020-06-02 DIAGNOSIS — L814 Other melanin hyperpigmentation: Secondary | ICD-10-CM | POA: Diagnosis not present

## 2020-06-02 DIAGNOSIS — D225 Melanocytic nevi of trunk: Secondary | ICD-10-CM | POA: Diagnosis not present

## 2020-06-02 DIAGNOSIS — L821 Other seborrheic keratosis: Secondary | ICD-10-CM | POA: Diagnosis not present

## 2020-06-02 DIAGNOSIS — L578 Other skin changes due to chronic exposure to nonionizing radiation: Secondary | ICD-10-CM | POA: Diagnosis not present

## 2020-06-02 DIAGNOSIS — L570 Actinic keratosis: Secondary | ICD-10-CM | POA: Diagnosis not present

## 2020-06-04 DIAGNOSIS — R102 Pelvic and perineal pain: Secondary | ICD-10-CM | POA: Diagnosis not present

## 2020-06-04 DIAGNOSIS — N5201 Erectile dysfunction due to arterial insufficiency: Secondary | ICD-10-CM | POA: Diagnosis not present

## 2020-06-18 DIAGNOSIS — K649 Unspecified hemorrhoids: Secondary | ICD-10-CM | POA: Diagnosis not present

## 2020-06-25 DIAGNOSIS — N401 Enlarged prostate with lower urinary tract symptoms: Secondary | ICD-10-CM | POA: Diagnosis not present

## 2020-06-25 DIAGNOSIS — R351 Nocturia: Secondary | ICD-10-CM | POA: Diagnosis not present

## 2020-06-25 DIAGNOSIS — N5201 Erectile dysfunction due to arterial insufficiency: Secondary | ICD-10-CM | POA: Diagnosis not present

## 2020-07-03 ENCOUNTER — Ambulatory Visit: Payer: BLUE CROSS/BLUE SHIELD | Admitting: Internal Medicine

## 2020-07-16 DIAGNOSIS — M67911 Unspecified disorder of synovium and tendon, right shoulder: Secondary | ICD-10-CM | POA: Diagnosis not present

## 2020-07-25 DIAGNOSIS — M545 Low back pain, unspecified: Secondary | ICD-10-CM | POA: Diagnosis not present

## 2020-09-10 DIAGNOSIS — M67911 Unspecified disorder of synovium and tendon, right shoulder: Secondary | ICD-10-CM | POA: Diagnosis not present

## 2020-09-18 DIAGNOSIS — H52203 Unspecified astigmatism, bilateral: Secondary | ICD-10-CM | POA: Diagnosis not present

## 2020-09-18 DIAGNOSIS — H5213 Myopia, bilateral: Secondary | ICD-10-CM | POA: Diagnosis not present

## 2020-09-18 DIAGNOSIS — H10413 Chronic giant papillary conjunctivitis, bilateral: Secondary | ICD-10-CM | POA: Diagnosis not present

## 2020-10-20 DIAGNOSIS — M75111 Incomplete rotator cuff tear or rupture of right shoulder, not specified as traumatic: Secondary | ICD-10-CM | POA: Diagnosis not present

## 2020-10-21 ENCOUNTER — Other Ambulatory Visit: Payer: Self-pay | Admitting: Orthopedic Surgery

## 2020-10-21 DIAGNOSIS — M75111 Incomplete rotator cuff tear or rupture of right shoulder, not specified as traumatic: Secondary | ICD-10-CM

## 2020-10-31 DIAGNOSIS — Z79899 Other long term (current) drug therapy: Secondary | ICD-10-CM | POA: Diagnosis not present

## 2020-10-31 DIAGNOSIS — Z0000 Encounter for general adult medical examination without abnormal findings: Secondary | ICD-10-CM | POA: Diagnosis not present

## 2020-10-31 DIAGNOSIS — Z131 Encounter for screening for diabetes mellitus: Secondary | ICD-10-CM | POA: Diagnosis not present

## 2020-10-31 DIAGNOSIS — Z125 Encounter for screening for malignant neoplasm of prostate: Secondary | ICD-10-CM | POA: Diagnosis not present

## 2020-10-31 DIAGNOSIS — E7800 Pure hypercholesterolemia, unspecified: Secondary | ICD-10-CM | POA: Diagnosis not present

## 2020-11-03 ENCOUNTER — Ambulatory Visit
Admission: RE | Admit: 2020-11-03 | Discharge: 2020-11-03 | Disposition: A | Discharge status: Home / Self Care | Payer: BC Managed Care – PPO | Source: Ambulatory Visit | Attending: Orthopedic Surgery | Admitting: Orthopedic Surgery

## 2020-11-03 ENCOUNTER — Other Ambulatory Visit: Payer: Self-pay

## 2020-11-03 DIAGNOSIS — M25511 Pain in right shoulder: Secondary | ICD-10-CM | POA: Diagnosis not present

## 2020-11-03 DIAGNOSIS — M75111 Incomplete rotator cuff tear or rupture of right shoulder, not specified as traumatic: Secondary | ICD-10-CM

## 2020-11-05 DIAGNOSIS — M67911 Unspecified disorder of synovium and tendon, right shoulder: Secondary | ICD-10-CM | POA: Diagnosis not present

## 2021-02-11 DIAGNOSIS — M25511 Pain in right shoulder: Secondary | ICD-10-CM | POA: Diagnosis not present

## 2021-02-25 DIAGNOSIS — M205X1 Other deformities of toe(s) (acquired), right foot: Secondary | ICD-10-CM | POA: Diagnosis not present

## 2021-03-16 DIAGNOSIS — N401 Enlarged prostate with lower urinary tract symptoms: Secondary | ICD-10-CM | POA: Diagnosis not present

## 2021-03-16 DIAGNOSIS — R351 Nocturia: Secondary | ICD-10-CM | POA: Diagnosis not present

## 2021-03-16 DIAGNOSIS — N5201 Erectile dysfunction due to arterial insufficiency: Secondary | ICD-10-CM | POA: Diagnosis not present

## 2021-03-25 DIAGNOSIS — R351 Nocturia: Secondary | ICD-10-CM | POA: Diagnosis not present

## 2021-03-25 DIAGNOSIS — N5201 Erectile dysfunction due to arterial insufficiency: Secondary | ICD-10-CM | POA: Diagnosis not present

## 2021-03-25 DIAGNOSIS — N401 Enlarged prostate with lower urinary tract symptoms: Secondary | ICD-10-CM | POA: Diagnosis not present

## 2021-06-03 DIAGNOSIS — L821 Other seborrheic keratosis: Secondary | ICD-10-CM | POA: Diagnosis not present

## 2021-06-03 DIAGNOSIS — L578 Other skin changes due to chronic exposure to nonionizing radiation: Secondary | ICD-10-CM | POA: Diagnosis not present

## 2021-06-03 DIAGNOSIS — D225 Melanocytic nevi of trunk: Secondary | ICD-10-CM | POA: Diagnosis not present

## 2021-06-03 DIAGNOSIS — L814 Other melanin hyperpigmentation: Secondary | ICD-10-CM | POA: Diagnosis not present

## 2021-06-03 DIAGNOSIS — Z23 Encounter for immunization: Secondary | ICD-10-CM | POA: Diagnosis not present

## 2021-09-22 DIAGNOSIS — H10413 Chronic giant papillary conjunctivitis, bilateral: Secondary | ICD-10-CM | POA: Diagnosis not present

## 2021-09-22 DIAGNOSIS — H52203 Unspecified astigmatism, bilateral: Secondary | ICD-10-CM | POA: Diagnosis not present

## 2021-09-22 DIAGNOSIS — H5213 Myopia, bilateral: Secondary | ICD-10-CM | POA: Diagnosis not present

## 2021-11-11 DIAGNOSIS — Z79899 Other long term (current) drug therapy: Secondary | ICD-10-CM | POA: Diagnosis not present

## 2021-11-11 DIAGNOSIS — E7800 Pure hypercholesterolemia, unspecified: Secondary | ICD-10-CM | POA: Diagnosis not present

## 2021-11-11 DIAGNOSIS — Z131 Encounter for screening for diabetes mellitus: Secondary | ICD-10-CM | POA: Diagnosis not present

## 2021-11-11 DIAGNOSIS — Z0000 Encounter for general adult medical examination without abnormal findings: Secondary | ICD-10-CM | POA: Diagnosis not present

## 2022-03-01 DIAGNOSIS — R351 Nocturia: Secondary | ICD-10-CM | POA: Diagnosis not present

## 2022-03-01 DIAGNOSIS — N401 Enlarged prostate with lower urinary tract symptoms: Secondary | ICD-10-CM | POA: Diagnosis not present

## 2022-03-08 DIAGNOSIS — R351 Nocturia: Secondary | ICD-10-CM | POA: Diagnosis not present

## 2022-03-08 DIAGNOSIS — N5201 Erectile dysfunction due to arterial insufficiency: Secondary | ICD-10-CM | POA: Diagnosis not present

## 2022-03-08 DIAGNOSIS — N401 Enlarged prostate with lower urinary tract symptoms: Secondary | ICD-10-CM | POA: Diagnosis not present
